# Patient Record
Sex: Male | Born: 1940 | Race: Black or African American | Hispanic: No
Health system: Southern US, Community
[De-identification: ages and names within clinical notes are randomized; demographics above are authoritative.]

## PROBLEM LIST (undated history)

## (undated) DIAGNOSIS — C799 Secondary malignant neoplasm of unspecified site: Secondary | ICD-10-CM

## (undated) DIAGNOSIS — C259 Malignant neoplasm of pancreas, unspecified: Secondary | ICD-10-CM

## (undated) HISTORY — DX: Malignant neoplasm of pancreas, unspecified: C25.9

## (undated) HISTORY — DX: Secondary malignant neoplasm of unspecified site: C79.9

---

## 2002-10-01 ENCOUNTER — Encounter: Payer: Self-pay | Admitting: *Deleted

## 2002-10-01 ENCOUNTER — Ambulatory Visit (HOSPITAL_COMMUNITY): Admission: RE | Admit: 2002-10-01 | Discharge: 2002-10-01 | Payer: Self-pay | Admitting: *Deleted

## 2004-05-29 ENCOUNTER — Inpatient Hospital Stay (HOSPITAL_COMMUNITY): Admission: EM | Admit: 2004-05-29 | Discharge: 2004-06-01 | Payer: Self-pay | Admitting: Emergency Medicine

## 2005-01-01 ENCOUNTER — Ambulatory Visit (HOSPITAL_COMMUNITY): Admission: RE | Admit: 2005-01-01 | Discharge: 2005-01-01 | Payer: Self-pay | Admitting: Family Medicine

## 2005-09-09 IMAGING — NM NM MYOCAR PERF WALL MOTION
2 series · 12 of 12 positions shown · non-contrast
Comparison: none

CLINICAL DATA: Chest pain.
 NUCLEAR MEDICINE MYOCARDIAL PERFUSION, EJECTION FRACTION CALCULATION AND WALL MOTION STUDY AND MULTIPLE SPECT IMAGING WITH REST AND EXERCISE STRESS ? 05/31/04
 No prior studies. 
 Radiopharmaceutical:  10 mCi of Technetium 99m Sestamibi IV at rest and 30 mCi of Technetium 99m Sestamibi IV with exercise stress. 
 EJECTION FRACTION CALCULATION
 Utilizing gated data, the end diastolic volume is estimated to be 123 cc and the end systolic volume 53 cc.  Calculated ejection fraction is 57 percent. 
 QGS LVEF of 57 percent.
 WALL MOTION ANALYSIS
 The gated study shows normal left ventricular wall motion. 
 IMPRESSION 
 Normal wall motion study.
 MULTIPLE SPECT IMAGING WITH REST AND EXERCISE STRESS
 The patient was exercised on a treadmill utilizing Bruce protocol.  He achieved a maximum heart rate of 149 beats per minute.  This is 93 percent of predicted max for age.  SPECT images show mild attenuation of the inferior wall on both stress and rest studies.  This may be on the basis of diaphragmatic attenuation or scar.  Convincing ischemia is not identified.  
 Inferior wall attenuation without convincing ischemia. This may be on the basis of diaphragmatic attenuation and/or scar.

[Series 1: cs cardiac tc hi dose · 6.52mm/px · 6 of 511 frames shown]
[frame 43/511]
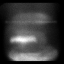
[frame 128/511]
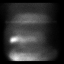
[frame 213/511]
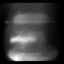
[frame 298/511]
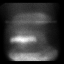
[frame 383/511]
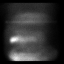
[frame 469/511]
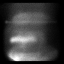

[Series 1: cr cardiac tc low dose · 6.52mm/px · 6 of 64 frames shown]
[frame 6/64]
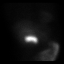
[frame 16/64]
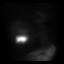
[frame 27/64]
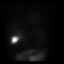
[frame 38/64]
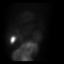
[frame 48/64]
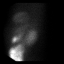
[frame 59/64]
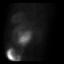

[12 of 12 positions shown; findings below may reference images not displayed]

## 2005-09-20 ENCOUNTER — Ambulatory Visit: Payer: Self-pay | Admitting: Unknown Physician Specialty

## 2007-04-09 ENCOUNTER — Ambulatory Visit: Payer: Self-pay | Admitting: Unknown Physician Specialty

## 2007-08-20 ENCOUNTER — Ambulatory Visit: Payer: Self-pay | Admitting: Unknown Physician Specialty

## 2010-01-25 ENCOUNTER — Ambulatory Visit: Payer: Self-pay | Admitting: Internal Medicine

## 2010-01-25 DIAGNOSIS — Z860100 Personal history of colon polyps, unspecified: Secondary | ICD-10-CM | POA: Insufficient documentation

## 2010-01-25 DIAGNOSIS — R112 Nausea with vomiting, unspecified: Secondary | ICD-10-CM

## 2010-01-25 DIAGNOSIS — K222 Esophageal obstruction: Secondary | ICD-10-CM

## 2010-01-25 DIAGNOSIS — R1310 Dysphagia, unspecified: Secondary | ICD-10-CM | POA: Insufficient documentation

## 2010-01-26 ENCOUNTER — Encounter: Payer: Self-pay | Admitting: Internal Medicine

## 2010-01-27 ENCOUNTER — Encounter: Payer: Self-pay | Admitting: Internal Medicine

## 2010-02-10 ENCOUNTER — Ambulatory Visit: Payer: Self-pay | Admitting: Internal Medicine

## 2010-02-10 ENCOUNTER — Ambulatory Visit (HOSPITAL_COMMUNITY): Admission: RE | Admit: 2010-02-10 | Discharge: 2010-02-10 | Payer: Self-pay | Admitting: Internal Medicine

## 2010-02-15 ENCOUNTER — Encounter: Payer: Self-pay | Admitting: Internal Medicine

## 2010-02-16 ENCOUNTER — Encounter: Payer: Self-pay | Admitting: Internal Medicine

## 2010-03-09 ENCOUNTER — Ambulatory Visit: Payer: Self-pay | Admitting: Internal Medicine

## 2010-03-09 DIAGNOSIS — K219 Gastro-esophageal reflux disease without esophagitis: Secondary | ICD-10-CM

## 2010-03-09 DIAGNOSIS — K5900 Constipation, unspecified: Secondary | ICD-10-CM | POA: Insufficient documentation

## 2010-03-10 ENCOUNTER — Ambulatory Visit: Payer: Self-pay | Admitting: Internal Medicine

## 2010-03-10 DIAGNOSIS — R141 Gas pain: Secondary | ICD-10-CM | POA: Insufficient documentation

## 2010-03-10 DIAGNOSIS — R143 Flatulence: Secondary | ICD-10-CM

## 2010-03-10 DIAGNOSIS — R142 Eructation: Secondary | ICD-10-CM

## 2010-03-11 DIAGNOSIS — K921 Melena: Secondary | ICD-10-CM

## 2010-03-11 LAB — CONVERTED CEMR LAB: Helicobacter Pylori Antibody-IgG: 0.4

## 2010-03-15 ENCOUNTER — Encounter: Payer: Self-pay | Admitting: Internal Medicine

## 2010-03-15 LAB — CONVERTED CEMR LAB
Basophils Absolute: 0 10*3/uL (ref 0.0–0.1)
Basophils Relative: 0 % (ref 0–1)
Eosinophils Absolute: 0.3 10*3/uL (ref 0.0–0.7)
Hemoglobin: 15 g/dL (ref 13.0–17.0)
MCV: 90.4 fL (ref 78.0–100.0)
Monocytes Relative: 6 % (ref 3–12)
Neutrophils Relative %: 58 % (ref 43–77)
Platelets: 211 10*3/uL (ref 150–400)
RDW: 15.1 % (ref 11.5–15.5)

## 2010-03-17 ENCOUNTER — Telehealth (INDEPENDENT_AMBULATORY_CARE_PROVIDER_SITE_OTHER): Payer: Self-pay

## 2010-04-01 ENCOUNTER — Encounter: Payer: Self-pay | Admitting: Urgent Care

## 2010-04-13 ENCOUNTER — Encounter: Payer: Self-pay | Admitting: Internal Medicine

## 2010-11-30 NOTE — Letter (Signed)
Summary: REFERRAL/[facility]  REFERRAL/[facility]   Imported By: Diana Eves 01/27/2010 15:08:47  _____________________________________________________________________  External Attachment:    Type:   Image     Comment:   External Document

## 2010-11-30 NOTE — Assessment & Plan Note (Signed)
Summary: fu in couple of weeks per RMR/labs prior to appt/ss   Visit Type:  Follow-up Visit Primary Care Provider:  [facility]  Chief Complaint:  follow up- doing ok most of the time.  History of Present Illness: 70 year old gentleman recent EGD demonstrated peptic esophageal requiring dilation. He also had gastric erosion and an  elliptical ulceration of the bulb no history of NSAIDs. Gastric biopsies negative for h pylori. He had blood drawn for H. pylori elsewhere but apparently the assay was not done. He has intermittent nonspecific upper abdominal bloating and constipation. Positive history colon cancer first-degree relative-mother at advanced age. He is getting high-risk screening at [facility]. He reportedly has diverticulosis.  Dysphagia resolve status post dilation. No melena or hematochezia. Appetite maintained.  Current Problems (verified): 1)  Adenocarcinoma, Colon, Family Hx  (ICD-V16.0) 2)  Colonic Polyps, Hx of  (ICD-V12.72) 3)  Nausea With Vomiting  (ICD-787.01) 4)  Dysphagia Unspecified  (ICD-787.20) 5)  Hx of Esophageal Stricture  (ICD-530.3)  Current Medications (verified): 1)  Prostaglandin .... As Directed 2)  Lovastatin 40 Mg Tabs (Lovastatin) .... Take 1 Tablet By Mouth Once A Day 3)  Flonase 50 Mcg/act Susp (Fluticasone Propionate) .... As Directed 4)  Zyrtec Allergy 10 Mg Caps (Cetirizine Hcl) .... Take 1 Tablet By Mouth Once A Day 5)  Cranberry Fruit  2000 Mg Plus Vit C .... Two Tablets Twice Dailey 6)  Men's Health Vitamin .... Take 1 Tablet By Mouth Once A Day 7)  Niacin 500 Mg .... Take 1 Tablet By Mouth Once A Day 8)  Fish Oil 1000 Mg .... Two Tablets Twice Daily 9)  Colon Cleanser .... Four Tablets Once Daily 10)  Osteo Biflex .... One Tablet Twice Daily 11)  Protonix 40 Mg Tbec (Pantoprazole Sodium) .... Once Daily  Allergies (verified): No Known Drug Allergies  Past History:  Past Medical History: Last updated: 01/25/2010 GERD, h/o  esophageal stricture in past H/O colon polyps, last TCS at [facility], approx 2008-2009. Hyperlipidemia Sleep Apnea, CPAP Allergies  Past Surgical History: Last updated: 01/25/2010 Prostatectomy for cancer, 2003 Hernia repair Testicular surgery for benign mass  Family History: Last updated: 01/25/2010 Mother, colon cancer, deceased, age late 15s, also h/o thyroid disease No FH liver disease Father, brain tumor, Htn  Social History: Last updated: 01/25/2010 Married. Three children. 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  Vital Signs:  Patient profile:   70 year old male Height:      70 inches Weight:      190 pounds BMI:     27.36 Temp:     97.8 degrees F oral Pulse rate:   60 / minute BP sitting:   122 / 62  (left arm) Cuff size:   regular  Vitals Entered By: Hendricks Limes LPN (Mar 09, 2010 8:48 AM)  Physical Exam  General:  alert conversant no acute distress accompanied by his sister Lungs:  clear to auscultation Abdomen:  flat positive bowel sounds, soft nontender without mass or organomegaly  Impression & Recommendations: Impression: GERD with peptic stricture status post dilation. Dysphagia resolved. He's taking Protonix 40 mg orally daily. Nonspecific upper abdominal discomfort history of gastric erosions and duodenal ulceration. NSAID negative, H. pylori negative so far based on histology; H. pylori serologies not yet done.  Constipation  Recommendations: Continue Protonix 40 mg orally indefinitely  Add Benefiber 1 tablespoon daily to his regimen. Add digestive advantage since about support one capsule daily obtain H. pylori serologies. Office  visit here in 3 months  Appended Document: Orders Update    Clinical Lists Changes  Problems: Added new problem of GERD (ICD-530.81) Added new problem of CONSTIPATION (ICD-564.00) Orders: Added new Service order of Est. Patient Level IV (16109) - Signed      Appended Document: fu in  couple of weeks per RMR/labs prior to appt/ss reminder in computer

## 2010-11-30 NOTE — Assessment & Plan Note (Signed)
Summary: DROPPED OFF STOOL/SS  pt returned ifobt- it was positive  Allergies: No Known Drug Allergies  Other Orders: Immuno-chemical Fecal Occult (16109)  Appended Document: DROPPED OFF STOOL/SS hp negative, occult blood positive; needs a tcs now and may need capsule study of small bowel; would utilized the ped scope.  Appended Document: Orders Update Dr. Jena Gauss discussed case with me. He wants you to request last TCS/path from [facility], done in last 5 years. Need ASAP.  He also wants CBC on patient.   Then will decide about TCS.   Clinical Lists Changes  Problems: Added new problem of HEMOCCULT POSITIVE STOOL (ICD-578.1) Orders: Added new Test order of T-CBC w/Diff [phone]) - Signed      Appended Document: DROPPED OFF STOOL/SS Pt informed. Lab order faxed to [facility]  Appended Document: DROPPED OFF STOOL/SS records have been requested

## 2010-11-30 NOTE — Letter (Signed)
Summary: Internal Other/lab order  Internal Other/lab order   Imported By: Cloria Spring LPN 16/10/930 35:57:32  _____________________________________________________________________  External Attachment:    Type:   Image     Comment:   External Document

## 2010-11-30 NOTE — Assessment & Plan Note (Signed)
Summary: npp,vomiting,nausea,diff swallowing. glu   Visit Type:  Initial Consult Referring Provider:  Jerome Otter Blankenship Primary Care Provider:  Hezekiah Veltre Blankenship  Chief Complaint:  Vomiting/nausea/dif swallowing.  History of Present Illness: [name] is a pleasant 70 y/o AA male, patient of Dr. Keli Buehner Blankenship, who present for further evaluation of N/V and difficulty swallowing. He has h/o esphageal stricture s/p dilation around 2008. Dilation helped but over the last one year he has noted increased difficulty swallowing pills. He has to thoroughly chew his food and wash down with liquid. Denies typical heartburn. C/O intermittent spitting up mucous or vomiting clear liquid. No bloody emesis. Symptoms worse at night. He stopped Nexium several months ago because his doctor told him he may "get salmonella poisoning". He has intermittent epigastric soreness especially with increased vomiting. BM okay with intermittent diarrhea from "nervous stomach".  No brbpr or melena. Last TCS a couple of years ago, done at [facility] because of "tight turn" and incomplete TCS in [place]. Had polyps. FH of CRC, mother. He plans to have f/u TCS at [facility].    Current Medications (verified): 1)  Prostaglandin .... As Directed 2)  Lovastatin 40 Mg Tabs (Lovastatin) .... Take 1 Tablet By Mouth Once A Day 3)  Flonase 50 Mcg/act Susp (Fluticasone Propionate) .... As Directed 4)  Zyrtec Allergy 10 Mg Caps (Cetirizine Hcl) .... Take 1 Tablet By Mouth Once A Day 5)  Cranberry Fruit  2000 Mg Plus Vit C .... Two Tablets Twice Dailey 6)  Men's Health Vitamin .... Take 1 Tablet By Mouth Once A Day 7)  Niacin 500 Mg .... Take 1 Tablet By Mouth Once A Day 8)  Fish Oil 1000 Mg .... Two Tablets Twice Daily 9)  Colon Cleanser .... Four Tablets Once Daily 10)  Osteo Biflex .... One Tablet Twice Daily  Allergies (verified): No Known Drug Allergies  Past History:  Past Medical History: GERD, h/o esophageal stricture in past H/O colon polyps, last  TCS at [facility], approx 2008-2009. Hyperlipidemia Sleep Apnea, CPAP Allergies  Past Surgical History: Prostatectomy for cancer, 2003 Hernia repair Testicular surgery for benign mass  Family History: Mother, colon cancer, deceased, age late 53s, also h/o thyroid disease No FH liver disease Father, brain tumor, Htn  Social History: Married. Three children. 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  Review of Systems General:  Denies fever, chills, sweats, anorexia, fatigue, weakness, and weight loss. Eyes:  Denies vision loss. ENT:  Denies nasal congestion, sore throat, hoarseness, and difficulty swallowing. CV:  Denies chest pains, angina, palpitations, dyspnea on exertion, and peripheral edema. Resp:  Denies dyspnea at rest, dyspnea with exercise, and cough. GI:  See HPI. GU:  Denies urinary burning and blood in urine. MS:  Denies joint pain / LOM. Derm:  Denies rash and itching. Neuro:  Denies weakness, paralysis, frequent headaches, memory loss, and confusion. Psych:  Denies depression and anxiety. Endo:  Denies unusual weight change. Heme:  Denies bruising and bleeding. Allergy:  Denies hives and rash.  Vital Signs:  Patient profile:   70 year old male Height:      70 inches Weight:      193 pounds BMI:     27.79 Temp:     98.5 degrees F Pulse rate:   64 / minute BP sitting:   110 / 70  (left arm) Cuff size:   regular  Vitals Entered By: Cloria Spring LPN (January 25, 2010 9:42 AM)  Physical Exam  General:  Well developed, well  nourished, no acute distress. Head:  Normocephalic and atraumatic. Eyes:  Conjunctivae pink, no scleral icterus.  Mouth:  Oropharyngeal mucosa moist, pink.  No lesions, erythema or exudate.    Neck:  Supple; no masses or thyromegaly. Lungs:  Clear throughout to auscultation. Heart:  Regular rate and rhythm; no murmurs, rubs,  or bruits. Abdomen:  Bowel sounds normal.  Abdomen is soft, nontender, nondistended.  No  rebound or guarding.  No hepatosplenomegaly, masses or hernias.  No abdominal bruits.  Extremities:  No clubbing, cyanosis, edema or deformities noted. Neurologic:  Alert and  oriented x4;  grossly normal neurologically. Skin:  Intact without significant lesions or rashes. Cervical Nodes:  No significant cervical adenopathy. Psych:  Alert and cooperative. Normal mood and affect.  Impression & Recommendations:  Problem # 1:  DYSPHAGIA UNSPECIFIED (ICD-787.20)  H/O dysphagia to pills and solid foods with h/o esophageal stricture s/p dilation several years ago. C/O intermittent nocturnal regurgitation and pp vomiting. Suspect he has recurrent stricture. Cannot exclude hiatal hernia or PUD as part of the problem to explain his intermittent vomiting. I do not get a sense that this is biliary in etiology. EGD/ED to be performed in near future.  Risks, alternatives, benefits including but not limited to risk of reaction to medications, bleeding, infection, and perforation addressed.  Patient voiced understanding and verbal consent obtained.   Orders: Consultation Level III (84132)  Problem # 2:  COLONIC POLYPS, HX OF (ICD-V12.72)  H/O colonic polyps and FH CRC in first degree relative. Difficult TCS per patient, required exam to be completed at [facility]. He plans to f/u at [facility] for future TCS.  Orders: Consultation Level III (44010)    I would like to thank Dr. Tyden Kann Blankenship for allowing [place] to take part in the care of this nice patient.

## 2010-11-30 NOTE — Progress Notes (Signed)
Summary: pantoprazole refill  Phone Note Call from Patient Call back at Home Phone [phone]   Caller: Patient Summary of Call: pt called- left voicemail- needs refills of pantoprazole sent to [facility] ([address] He has been out since last week. Initial call taken by: Hendricks Limes LPN,  Mar 17, 2010 2:32 PM     Appended Document: pantoprazole refill    Prescriptions: PANTOPRAZOLE SODIUM 40 MG TBEC (PANTOPRAZOLE SODIUM) one by mouth 30 mins before breakfast daily  #30 x 11   Entered and Authorized by:   Leanna Battles. Dixon Boos   Signed by:   Leanna Battles Dixon Boos on 03/17/2010   Method used:   Electronically to        [facility] (retail)       [address]       [place], [place]  [address]       Ph: [phone]       Fax: [phone]   RxID:   [id number]

## 2010-11-30 NOTE — Miscellaneous (Signed)
Summary: Orders Update  Clinical Lists Changes  Orders: Added new Test order of T-Helicobacter AB - IgG ([id number]) - Signed 

## 2010-11-30 NOTE — Letter (Signed)
Summary: External Other  External Other   Imported By: Peggyann Shoals 04/13/2010 10:54:47  _____________________________________________________________________  External Attachment:    Type:   Image     Comment:   External Document

## 2010-11-30 NOTE — Letter (Signed)
Summary: EGD/ED ORDER  EGD/ED ORDER   Imported By: Ave Filter 01/26/2010 08:31:34  _____________________________________________________________________  External Attachment:    Type:   Image     Comment:   External Document

## 2010-11-30 NOTE — Medication Information (Signed)
Summary: PA for pantoprazole  PA for pantoprazole   Imported By: Hendricks Limes LPN 16/08/9603 54:09:81  _____________________________________________________________________  External Attachment:    Type:   Image     Comment:   External Document

## 2011-03-18 NOTE — Procedures (Signed)
NAME:  [name], MCEUEN                         ACCOUNT NO.:  [id number]   MEDICAL RECORD NO.:  [id number]                   PATIENT TYPE:  INP   LOCATION:  A203                                 FACILITY:  [facility]   PHYSICIAN:  Dani Gobble, MD                    DATE OF BIRTH:  1941-02-26   DATE OF PROCEDURE:  05/31/2004  DATE OF DISCHARGE:                                  ECHOCARDIOGRAM   REFERRING PHYSICIAN:  1. Dr. Roxan Hockey.  2. Dr. Domingo Sep.   INDICATIONS FOR PROCEDURE:  Mr. Statzer is a 70 year old gentleman who was  admitted with chest discomfort.   The technical quality of the study is adequate.   The aorta is within normal limits at 2.8 cm.   The left atrium is also within normal limits at 3.9 cm.  No obvious clots or  masses were appreciated, and the patient appeared to be in sinus rhythm  during this procedure.   The left ventricular septum is mildly thickened at 1.4 cm, while the  posterior wall is within normal limits at  1 cm.   The aortic valve appears grossly structurally normal with normal leaflet  excursion.  No aortic insufficiency is noted.  Doppler interrogation of the  aortic valve is within normal limits.   The mitral valve also appears structurally normal.  No mitral valve prolapse  is noted.  Mild mitral regurgitation is noted.  Doppler interrogation of the  mitral valve is within normal limits.   The pulmonic valve was incompletely visualized, but appeared to be grossly  structurally normal.   The tricuspid valve also appears grossly structurally normal with trace to  mild tricuspid regurgitation noted.   The left ventricle is normal in size with the LVIDD measuring at 4.8 cm and  the LVISD measuring 3.3 cm.  Overall left ventricular systolic function is  normal with no regional wall motion abnormalities appreciated.  The right  atrium is normal in size.  The right ventricle is mildly _________, but  normal right ventricular systolic function.   IMPRESSION:  1. Mild asymmetric septal hypertrophy.  2. Mild mitral and tricuspid regurgitation.  3. Normal left ventricular size and systolic function without regional wall     motion abnormalities noted.  4. Mild ____________left ventricle, but with normal right ventricular     systolic function.      ___________________________________________                                            Dani Gobble, MD   AB/MEDQ  D:  05/31/2004  T:  05/31/2004  Job:  [id number]   cc:   Dr. Roxan Hockey

## 2011-03-18 NOTE — Discharge Summary (Signed)
NAME:  [name], [name]                         ACCOUNT NO.:  [id number]   MEDICAL RECORD NO.:  [id number]                   PATIENT TYPE:  INP   LOCATION:  A203                                 FACILITY:  [facility]   PHYSICIAN:  [name], M.D.              DATE OF BIRTH:  [birth year]   DATE OF ADMISSION:  05/29/2004  DATE OF DISCHARGE:  06/01/2004                                 DISCHARGE SUMMARY   PRIMARY CARE PHYSICIAN:  [name] at [facility].   CONSULTATIONS:  Cardiology consultation this admission, [facility]  Cardiology, Dr. Domingo Sep.   SPECIAL INVESTIGATIONS:  This admission the patient underwent a nuclear  stress test which revealed no evidence of ischemia. An electrocardiogram was  negative for ischemia.  Excellent overall exercise tolerance but a  hypertensive response to exercise.   DISCHARGE DIAGNOSES:  1. Chest pain, myocardial infarction ruled out.  2. Dizziness, probably secondary to mild heat exhaustion.  3. Hypertensive response to exercise.  4. Hypertriglyceridemia.  5. Tobacco abuse.  6. History of bronchitis.  7. History of obstructive sleep apnea, noncompliant with home CPAP.   DISPOSITION:  Patient is discharged to home.   CONDITION ON DISCHARGE:  Stable.   DISCHARGE MEDICATIONS:  Aspirin 81 mg daily.   HOSPITAL COURSE:  Please refer to history and physical of May 29, 2004.  This 70 year old African-American man had been working out in the hot sun  and after he laid down to rest he became dizzy and developed chest pain.  He  was brought into the emergency room and diagnosed with unstable angina.  An  acute myocardial infarction was ruled out by serial cardiac enzymes and  serial electrocardiogram's.  He had a nuclear stress test done yesterday  which was read today as showing no evidence of ischemia.   His admission blood pressure was 148/86 with heart rate of 68.  However,  throughout his hospital stay his blood pressure  systolic has been in the  110's typically 117.  However, during his exercise stress test his blood  pressure rose as high as 212/82.  His cardiologist elected to start him on a  low dose of Norvasc 2.5 mg.  However, on review this morning the patient is  complaining still of being a little dizzy, although he is not orthostatic.  We have elected that his antihypertensive should be discontinued and he  should monitor his blood pressure  on an outpatient basis and report the  readings to his primary care physician.  His primary care physician can make  a decision whether she wants to restart his blood pressure medications or  not.   The patient gives a history of bronchitis and has been educated on the  dangers of tobacco abuse.  He seems to have a clear understanding and has  expressed a desire to discontinue and feels he can discontinue of his own  volition.   The  patient had two fasting lipid panels while in hospital.  On the first  panel his triglycerides were elevated at 261 but his LDL was 88.  On the  second panel his triglycerides was 437 and LDL was not measured.  The  patient also had a direct LDL measurement which is 121 and hemoglobin A1C  which was 6.1. Based on these findings, the patient has been advised to go  on a low carbohydrate diet and to have his fasting lipid panel followed up  in the next few weeks and months by his primary care physician.  If  triglycerides continue to be elevated he may need to be started on Lopid, if  LDL becomes elevated he may need to be started on a statin.   Today, the patient is alert and oriented and anxious to get home.  He says  he is still having some occasional dizziness.  His vital signs show  temperature 96.2, pulse 58, respirations 20, blood pressure 117/69, he is  saturating at 98% on room air.  Pupils equal, round, reactive to light.  His  chest is clear to auscultation bilaterally.  Cardiovascular system has  regular rate and  rhythm without murmurs.  Abdomen is obese, soft and  nontender.  He has no lower extremity edema.  His pulses are 3+.  potassium   LABORATORY DATA:  White blood cell count is 8.1, hematocrit is 42.5.  Serum  chemistries are completely normal. His total cholesterol is 193,  triglycerides 437, HDL 29.  D-dimer on admission was normal at 0.22.  His  cardiac enzymes have been completely negative throughout his hospital stay.  Hemoglobin A1C was 6.1 and thyroid function tests were apparently not done.  His primary care physician may choose to get some work done in this  direction.   FOLLOW UP:  He will follow up with his primary care physician, [name]  at [facility].   DISCHARGE INSTRUCTIONS:  1. Discontinue smoking.  2. Avoid working out in the hot sun.  3. Low carbohydrate diet and follow up with checks of his lipid panel.  4. Check blood pressure regularly and take records to primary care     physician.  5. Resume use of CPAP at bedtime as poor quality of sleep can cause     dizziness during the day, can cause elevations of blood pressure and can     cause chest pain.     ___________________________________________                                         [name], M.D.   LC/MEDQ  D:  06/01/2004  T:  06/01/2004  Job:  [id number]   cc:   Dr. Merlene Pulling. Pract.   Dani Gobble, MD  Fax: [phone]

## 2011-03-18 NOTE — H&P (Signed)
NAME:  [name], [name]                         ACCOUNT NO.:  [id number]   MEDICAL RECORD NO.:  [id number]                   PATIENT TYPE:  INP   LOCATION:  A203                                 FACILITY:  [facility]   PHYSICIAN:  Michaelyn Barter, M.D.              DATE OF BIRTH:  Jan 17, 1941   DATE OF ADMISSION:  05/29/2004  DATE OF DISCHARGE:                                HISTORY & PHYSICAL   CHIEF COMPLAINT:  Dizziness, chest pain, chest tightness.   HISTORY OF PRESENT ILLNESS:  The patient is a 70 year old male who states  that at approximately 8 a.m. he became dizzy and developed pain in his  chest.  The chest pain was continuous up until approximately 3 p.m.  His  chest began to feel tight, so he decided to come to the hospital.  He was at  work when his chest pain began.  The pain is described as dull, mid sternal,  with no radiation to the neck, or down the arm.  The pain does not travel to  his back either.  He stated that earlier in the day he felt nauseated and  diaphoretic, however, this went away once he sat down.  He states that he  can walk very long distances without getting short of breath or having chest  pain.  He denies PND or orthopnea.  His dizziness started when he first got  out of bed this morning which was approximately at around 4 in the morning.  He said it slowly went away around 8 a.m.; however, it later returned and  lasted throughout the remainder of the day.  He said the room appears to  spin.  He took his blood pressure at work, and it was 148/86.  His heart  rate was 68.  He denies having any aggravating or alleviating factors for  his chest pain.   HOME MEDICATIONS:  Multivitamin.   PAST MEDICAL HISTORY:  1. Bronchitis.  2. Obstructive sleep apnea, diagnosed in 2003.  The patient has a sleep     machine at home; however, he has not used it for over one year.   PAST SURGICAL HISTORY:  1. Prostate cancer.  The patient is status post total prostatectomy  in 2003.  2. Cervical disk fusion in 2000.   ALLERGIES:  None.   FAMILY HISTORY:  Mother:  Positive history of colon cancer.  Father:  Cancer  of the brain.  Sister has coronary artery disease.   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  Cigarettes:  The patient smokes one pack per day, started at the age of 56.  Alcohol:  The patient denies drinking alcohol for at least the past ten  years.  Street drugs:  The patient denies.   REVIEW OF SYSTEMS:  As per HPI, otherwise all other systems are negative.   PHYSICAL EXAMINATION:  GENERAL:  The patient looks  comfortable, no obvious  distress.  VITAL SIGNS:  On admission, blood pressure 144/76, heart rate 64,  respirations 22, temperature 98.3.  HEENT:  Extraocular movements are intact.  Pupils react equally to light.  NECK:  Supple.  No lymphadenopathy.  Carotid upstroke is very soft.  No  bruits auscultated.  CHEST:  Has a reproducible pain component to it, mid sternal.  CARDIAC:  S1 S2 present.  Regular rate and rhythm.  No S3, no S4.  No  parasternal heave.  Positive palpable nondisplaced PMI.  RESPIRATORY:  Clear bilaterally.  No crackles, no wheezes.  ABDOMEN:  Soft, nontender, nondistended.  Positive bowel sounds.  EXTREMITIES:  No leg edema.  MUSCULOSKELETAL:  Strength 5/5 upper and lower extremities.  NEURO:  Pupils constrict equally to light.  Muddy sclerae present.   LABS:  Troponin I less than 0.05, CK-MB 1.1.  Brain natriuretic peptide less  than 30.  D-dimmer negative at 0.22.  BUN 11, creatinine 0.7.   ASSESSMENT/PLAN:  The patient's chest pain appears to be atypical in nature;  however, he has been admitted to rule out myocardial infarction.  It was  noticed by the emergency room attending that there was some upward sloping  of the ST segments on the patient's electrocardiogram, however, these appear  to possibly represent J point elevations, otherwise known as early  repolarization  but based on this finding plus the patient's complaint of  chest pain, he has been admitted.  The patient will be started on heparin 12  units/kg/hr by intravenous infusion.  Oxygen will be provided 2 liters via  nasal cannula.  He will also receive 81 mg of aspirin, sublingual  nitroglycerin 0.4 mg sublingual under the tongue will be provided to the  patient on a p.r.n. basis.  He has been admitted to a monitored/telemetry  unit, and he will receive medication for his pain.  Cardiac enzymes will be  followed times three to rule him out for myocardial infarction.     ___________________________________________                                         Michaelyn Barter, M.D.   OR/MEDQ  D:  05/30/2004  T:  05/30/2004  Job:  [id number]

## 2011-03-18 NOTE — Procedures (Signed)
NAME:  [name], [name]                         ACCOUNT NO.:  [id number]   MEDICAL RECORD NO.:  [id number]                   PATIENT TYPE:  INP   LOCATION:  A203                                 FACILITY:  [facility]   PHYSICIAN:  Dani Gobble, MD                    DATE OF BIRTH:  Jul 15, 1941   DATE OF PROCEDURE:  DATE OF DISCHARGE:                                    STRESS TEST   Mr. Comella is a very pleasant 70 year old gentleman who was admitted with  chest discomfort and dizziness.  He has a past medical history of prostate  cancer status post surgical excision 2001, glaucoma, chronic obstructive  pulmonary disease, obstructive sleep apnea.  He is referred for nuclear  stress test for evaluation of possible coronary ischemia as the etiology for  his chest pain today.   Baseline blood pressure 142/72 mmHg with resting pulse of 61 beats/minute.  He walked for 11 minutes and 8 seconds on a full Bruce protocol surpassing  his target heart rate of 136 beats/minute and proceeded to a peak heart rate  of 140 beats/minute.  He experienced no chest pain or dizziness during the  procedure.  He did have leg fatigue, but no true leg discomfort.  He also  complained of shortness of breath at peak exercise.  EKG at peak exercise  revealed sinus tachycardia with occasional PACs and PVCs and one atrial  couplet.  He had no significant ischemia noted on the EKG at peak exercise.  He recovered nicely within one to two minutes in recovery without further  EKG changes noted.  He did have a hypertensive response to exercise with  blood pressure of 212/82 mmHg.   IMPRESSION:  1. Clinically negative for angina.  2. EKG negative for ischemia.  3. Hypertensive response to exercise.  4. Excellent overall exercise tolerance.  5. Scintigraphic images are pending.  6. Will start low dose antihypertensive for hypertensive response to     exercise.      ___________________________________________                                Dani Gobble, MD   AB/MEDQ  D:  05/31/2004  T:  05/31/2004  Job:  [id number]   cc:   [name] in [place], [place]   Dr. Orvan Falconer at [facility]

## 2016-01-28 ENCOUNTER — Other Ambulatory Visit (HOSPITAL_COMMUNITY): Payer: Self-pay | Admitting: Sports Medicine

## 2016-01-28 DIAGNOSIS — R918 Other nonspecific abnormal finding of lung field: Secondary | ICD-10-CM

## 2016-02-02 ENCOUNTER — Ambulatory Visit (HOSPITAL_COMMUNITY)
Admission: RE | Admit: 2016-02-02 | Discharge: 2016-02-02 | Disposition: A | Discharge status: Home / Self Care | Payer: Medicare Other | Source: Ambulatory Visit | Attending: Sports Medicine | Admitting: Sports Medicine

## 2016-02-02 DIAGNOSIS — R918 Other nonspecific abnormal finding of lung field: Secondary | ICD-10-CM | POA: Diagnosis not present

## 2016-02-02 DIAGNOSIS — E279 Disorder of adrenal gland, unspecified: Secondary | ICD-10-CM | POA: Diagnosis not present

## 2016-02-02 DIAGNOSIS — J8410 Pulmonary fibrosis, unspecified: Secondary | ICD-10-CM | POA: Diagnosis not present

## 2016-02-02 DIAGNOSIS — K229 Disease of esophagus, unspecified: Secondary | ICD-10-CM | POA: Diagnosis not present

## 2016-02-02 MED ORDER — IOHEXOL 300 MG/ML  SOLN
75.0000 mL | Freq: Once | INTRAMUSCULAR | Status: AC | PRN
  Administered 2016-02-02: 75 mL via INTRAVENOUS

## 2017-03-28 ENCOUNTER — Other Ambulatory Visit (HOSPITAL_COMMUNITY): Payer: Self-pay | Admitting: Emergency Medicine

## 2017-03-28 DIAGNOSIS — E278 Other specified disorders of adrenal gland: Secondary | ICD-10-CM

## 2017-03-28 DIAGNOSIS — E279 Disorder of adrenal gland, unspecified: Principal | ICD-10-CM

## 2017-04-03 ENCOUNTER — Ambulatory Visit (HOSPITAL_COMMUNITY)
Admission: RE | Admit: 2017-04-03 | Discharge: 2017-04-03 | Disposition: A | Discharge status: Home / Self Care | Payer: Medicare HMO | Source: Ambulatory Visit | Attending: Emergency Medicine | Admitting: Emergency Medicine

## 2017-04-03 DIAGNOSIS — K869 Disease of pancreas, unspecified: Secondary | ICD-10-CM | POA: Insufficient documentation

## 2017-04-03 DIAGNOSIS — E279 Disorder of adrenal gland, unspecified: Secondary | ICD-10-CM | POA: Diagnosis present

## 2017-04-03 DIAGNOSIS — E278 Other specified disorders of adrenal gland: Secondary | ICD-10-CM

## 2017-04-03 LAB — POCT I-STAT CREATININE: Creatinine, Ser: 0.7 mg/dL (ref 0.61–1.24)

## 2017-04-03 MED ORDER — GADOBENATE DIMEGLUMINE 529 MG/ML IV SOLN
20.0000 mL | Freq: Once | INTRAVENOUS | Status: AC | PRN
  Administered 2017-04-03: 18 mL via INTRAVENOUS

## 2017-04-13 ENCOUNTER — Telehealth: Payer: Self-pay | Admitting: Gastroenterology

## 2017-04-13 NOTE — Telephone Encounter (Signed)
My Thursday MAC schedule is full next week (after I add colo/EGD for patient with new metastatic liver disease on MRI done this AM), the following week I am out of town and the following week I am covering hospitals.   I don't think he should wait 3-4 weeks for EUS, biopsy.  He had a colonoscopy and upper endoscopy through [facility] affiliate GI just last week.  I recommend given the timing issue that the patient be referred to their gastroenterologist who saw them last week and they arrange EUS referral  [facility] makes the most sense).  If that cannot work, I am happy to put on schedule for EUS, first available spot (the second week in July).    Please let me know  Thanks

## 2017-04-13 NOTE — Telephone Encounter (Signed)
I notified Freda Munro the referral coordinator at [facility] of Dr. Ardis Hughs recommendations.  I faxed her a referral form for [facility].  She will call back if they are unable to accommodate for EUS in [facility] July here.

## 2017-04-13 NOTE — Telephone Encounter (Signed)
Chart and records reviewed by Dr. Silverio Decamp.  She is happy to see him in the office this afternoon, but feels he would be best served by EUS. Dr. Ardis Hughs, I will place the referral, labs, CT, and MRI results in your office to review for EUS.  I left a message for the patient to call back and discuss.  Dr. Silverio Decamp is willing to see him this afternoon if the patient would like or wait and have the records reviewed for possible EUS.

## 2019-09-02 ENCOUNTER — Non-Acute Institutional Stay: Payer: Medicare Other | Admitting: Internal Medicine

## 2019-09-02 ENCOUNTER — Other Ambulatory Visit: Payer: Self-pay

## 2019-09-02 ENCOUNTER — Encounter: Payer: Self-pay | Admitting: Internal Medicine

## 2019-09-02 DIAGNOSIS — C259 Malignant neoplasm of pancreas, unspecified: Secondary | ICD-10-CM | POA: Insufficient documentation

## 2019-09-02 DIAGNOSIS — C787 Secondary malignant neoplasm of liver and intrahepatic bile duct: Secondary | ICD-10-CM

## 2019-09-02 DIAGNOSIS — Z515 Encounter for palliative care: Secondary | ICD-10-CM

## 2019-09-02 DIAGNOSIS — C799 Secondary malignant neoplasm of unspecified site: Secondary | ICD-10-CM | POA: Insufficient documentation

## 2019-09-02 NOTE — Progress Notes (Signed)
    Nov 2nd, 2020 [facility] Palliative Care Consult Note Telephone: [phone]  Fax: [phone]  Due to the current COVID-19 infection/crises, the patient and family prefer, and have given their verbal consent for, a provider visit via telehealth, from my office. HIPPA policies of confidentially were discussed. PATIENT NAME: [name] DOB: [birth year] MRN: [id number] [name] [id number] (admitted 08/16/2019) PRIMARY CARE PROVIDER Vidal Schwalbe, MD [address] [place],  [place] [address]  REFERRING PROVIDER:  Dr. Lauris Poag PA-C  RESPONSIBLE PARTY: (sister) Orvis Brill [id number]. (son) Gianni Hout [id number]. (daughter) Shalev Ridenbaugh [id number]  ASSESSMENT / RECOMMENDATIONS:  1. Advance Care Planning:  A. Directives: DNR and MOST forms present on chart. Details of MOST (signed by sister Talmage Nap): DNR/DNI. Limited Scope of Medical Intervention. Yes to Antibiotics and IVFs. No to Tube Feeding.  B. Goals of Care: Not discussed with patient today, d/t restlessness/discomfort. I will call his sister tomorrow in follow up. His nurse also happens to be a family member (patient is the cousin of nurses dad). Nurse relates plan to discharge patient to stay with patient's sister Enid Derry, or back to his own home with home health. Appears that patient would need 24/7 care.   2. Symptom Management: Pain: Reviewed with facility staff. Generalized discomfort managed with MSO4.  3.Cognitive / Functional status: Patient waxes and wanes in his level of confusion and alertness. Staff report he has poor short-term recall. Affect sweet and pleasant. Able to self-transfer and ambulate independently about room without use of assistive devices. Staff report patient consumes about 50% of his meals. His weight (08/16/2019) 123.4 bs. At a height 5'10" his BMI is 18kg/m2   4. Family Supports: sister Orvis Brill appears to be primary  responsible person.   5. Follow up Palliative Care Visit: Pending consultation with patient's sister. Looks drained. Going through so much.  generalized discomfort. "all I so is just sit and lay I spent 60 minutes providing this consultation from 2pm to 3pm.  More than 50% of the time in this consultation was spent coordinating communication.   HISTORY OF PRESENT ILLNESS:  [name] is a 78 y.o. 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 H/O prostate cancer, DM type 2, UTI, pneumonia, cognitive communication deficit, cerebral infarction. Palliative Care was asked to help address goals of care.   CODE STATUS: DNR  PPS: 30-40%  HOSPICE ELIGIBILITY/DIAGNOSIS: yes / metastatic pancreatic cancer.ALLERGIES: Not on File   PERTINENT MEDICATIONS:  No outpatient encounter medications on file as of 09/02/2019.   No facility-administered encounter medications on file as of 09/02/2019.     PHYSICAL EXAM:   General: NAD, frail, thin; appears restless d/t generalized discomfort. Oriented to self; knows he is at a rehab center.  PE deferred d/t tele-health nature of visit (facilitated by facility SW Al Pimple) Extremities: no edema, no joint deformities Skin: no rashes Neurological: Generalized Weakness   Julianne Handler, NP

## 2019-10-01 DEATH — deceased
